# Patient Record
Sex: Male | Born: 1975 | Hispanic: No | Marital: Single | Smoking: Never smoker
Health system: Southern US, Community
[De-identification: ages and names within clinical notes are randomized; demographics above are authoritative.]

---

## 2009-02-19 ENCOUNTER — Emergency Department (HOSPITAL_COMMUNITY): Admission: EM | Admit: 2009-02-19 | Discharge: 2009-02-19 | Payer: Self-pay | Admitting: Emergency Medicine

## 2009-02-21 ENCOUNTER — Encounter: Admission: RE | Admit: 2009-02-21 | Discharge: 2009-02-21 | Payer: Self-pay | Admitting: Emergency Medicine

## 2010-06-23 LAB — URINALYSIS, ROUTINE W REFLEX MICROSCOPIC
Bilirubin Urine: NEGATIVE
Hgb urine dipstick: NEGATIVE
Ketones, ur: NEGATIVE mg/dL
Protein, ur: NEGATIVE mg/dL
Urobilinogen, UA: 0.2 mg/dL (ref 0.0–1.0)

## 2010-06-23 LAB — COMPREHENSIVE METABOLIC PANEL
ALT: 15 U/L (ref 0–53)
AST: 14 U/L (ref 0–37)
Albumin: 3.7 g/dL (ref 3.5–5.2)
Calcium: 8.8 mg/dL (ref 8.4–10.5)
Glucose, Bld: 97 mg/dL (ref 70–99)
Sodium: 137 mEq/L (ref 135–145)
Total Protein: 7.2 g/dL (ref 6.0–8.3)

## 2010-06-23 LAB — DIFFERENTIAL
Eosinophils Relative: 5 % (ref 0–5)
Lymphocytes Relative: 33 % (ref 12–46)
Lymphs Abs: 1.9 10*3/uL (ref 0.7–4.0)

## 2010-06-23 LAB — CBC
MCHC: 34.9 g/dL (ref 30.0–36.0)
MCV: 89.5 fL (ref 78.0–100.0)
Platelets: 251 10*3/uL (ref 150–400)
RDW: 13 % (ref 11.5–15.5)

## 2010-06-23 LAB — LIPASE, BLOOD: Lipase: 28 U/L (ref 11–59)

## 2013-07-18 DIAGNOSIS — R42 Dizziness and giddiness: Secondary | ICD-10-CM | POA: Insufficient documentation

## 2013-07-18 DIAGNOSIS — R55 Syncope and collapse: Secondary | ICD-10-CM | POA: Insufficient documentation

## 2013-07-18 DIAGNOSIS — R61 Generalized hyperhidrosis: Secondary | ICD-10-CM | POA: Insufficient documentation

## 2013-07-19 ENCOUNTER — Encounter (HOSPITAL_COMMUNITY): Payer: Self-pay | Admitting: Emergency Medicine

## 2013-07-19 ENCOUNTER — Emergency Department (HOSPITAL_COMMUNITY)
Admission: EM | Admit: 2013-07-19 | Discharge: 2013-07-19 | Disposition: A | Discharge status: Home / Self Care | Payer: Self-pay | Attending: Emergency Medicine | Admitting: Emergency Medicine

## 2013-07-19 DIAGNOSIS — R55 Syncope and collapse: Secondary | ICD-10-CM

## 2013-07-19 LAB — CBG MONITORING, ED: GLUCOSE-CAPILLARY: 117 mg/dL — AB (ref 70–99)

## 2013-07-19 NOTE — ED Notes (Signed)
Patient from PEDS.  Diaphoretic, bradycardic

## 2013-07-19 NOTE — Discharge Instructions (Signed)
Please call your doctor for a followup appointment within 24-48 hours. When you talk to your doctor please let them know that you were seen in the emergency department and have them acquire all of your records so that they can discuss the findings with you and formulate a treatment plan to fully care for your new and ongoing problems. ° °

## 2013-07-19 NOTE — ED Provider Notes (Signed)
CSN: [id number]     Arrival date & time 07/18/13  2358 History   First MD Initiated Contact with Patient 07/19/13 0013     Chief Complaint  Patient presents with  . Bradycardia     (Consider location/radiation/quality/duration/timing/severity/associated sxs/prior Treatment) HPI Comments: 38 year old male, no significant past medical history on no medications presents with a complaint of near syncope. He had brought his child to the pediatric emergency Department because of a seizure and while child was being evaluated he began to become lightheaded and dizzy, felt sweaty and as though he was going to pass out. He was found to be bradycardic and hypotensive, transported to the adult emergency department. The patient denies any prodromal symptoms before arriving at the hospital and has had a normal day, normal appetite, no family history of cardiac disease, the patient has no prior history of syncope or unexplained cardiac complaints, at this time he still feels lightheaded but has no other complaints.  The history is provided by the patient.    History reviewed. No pertinent past medical history. History reviewed. No pertinent past surgical history. History reviewed. No pertinent family history. History  Substance Use Topics  . Smoking status: Never Smoker   . Smokeless tobacco: Never Used  . Alcohol Use: No    Review of Systems  All other systems reviewed and are negative.     Allergies  Review of patient's allergies indicates no known allergies.  Home Medications   Prior to Admission medications   Not on File   BP 110/77  Pulse 69  Temp(Src) 97.9 F (36.6 C) (Oral)  Resp 17  SpO2 100% Physical Exam  Nursing note and vitals reviewed. Constitutional: He appears well-developed and well-nourished. No distress.  HENT:  Head: Normocephalic and atraumatic.  Mouth/Throat: Oropharynx is clear and moist. No oropharyngeal exudate.  Eyes: Conjunctivae and EOM are normal.  Pupils are equal, round, and reactive to light. Right eye exhibits no discharge. Left eye exhibits no discharge. No scleral icterus.  Neck: Normal range of motion. Neck supple. No JVD present. No thyromegaly present.  Cardiovascular: Normal rate, regular rhythm and normal heart sounds.  Exam reveals no gallop and no friction rub.   No murmur heard. Slightly weak distal pulses at the radial arteries, normal capillary refill, no JVD  Pulmonary/Chest: Effort normal and breath sounds normal. No respiratory distress. He has no wheezes. He has no rales.  Abdominal: Soft. Bowel sounds are normal. He exhibits no distension and no mass. There is no tenderness.  Musculoskeletal: Normal range of motion. He exhibits no edema and no tenderness.  Lymphadenopathy:    He has no cervical adenopathy.  Neurological: He is alert. Coordination normal.  Skin: Skin is warm. No rash noted. He is diaphoretic. No erythema.  Psychiatric: He has a normal mood and affect. His behavior is normal.    ED Course  Procedures (including critical care time) Labs Review Labs Reviewed  CBG MONITORING, ED - Abnormal; Notable for the following:    Glucose-Capillary 117 (*)    All other components within normal limits    Imaging Review No results found.   EKG Interpretation   Date/Time:  Thursday July 19 2013 00:19:04 EDT Ventricular Rate:  61 PR Interval:  144 QRS Duration: 95 QT Interval:  379 QTC Calculation: 382 R Axis:   77 Text Interpretation:  Sinus rhythm Normal ECG No old tracing to compare  Confirmed by Jackee Glasner  MD, Prairie Stenberg ([id number]) on 07/19/2013 12:21:56 AM  MDM   Final diagnoses:  Vaso vagal episode    The patient's physical exam is rather benign, his blood pressure is 99/70, heart rate is around 60, this appears to be a vagal episode likely related to the emotional event surrounding his child's seizure. He has no prior cardiac history, rule out Wolff-Parkinson-White or other signs of primary  arrhythmia, fluid challenge, short observational period  Reevaluated, completely symptom-free at this time - EKG without signs of Wolff-Parkinson-White, informed patient, stable for  Spencer RollerBrian D Murrell Dome, MD 07/19/13 [phone]

## 2015-01-03 ENCOUNTER — Emergency Department (HOSPITAL_COMMUNITY)
Admission: EM | Admit: 2015-01-03 | Discharge: 2015-01-03 | Disposition: A | Discharge status: Home / Self Care | Payer: 59 | Attending: Emergency Medicine | Admitting: Emergency Medicine

## 2015-01-03 ENCOUNTER — Emergency Department (HOSPITAL_COMMUNITY): Payer: 59

## 2015-01-03 ENCOUNTER — Encounter (HOSPITAL_COMMUNITY): Payer: Self-pay | Admitting: Emergency Medicine

## 2015-01-03 DIAGNOSIS — R0602 Shortness of breath: Secondary | ICD-10-CM | POA: Insufficient documentation

## 2015-01-03 DIAGNOSIS — R519 Headache, unspecified: Secondary | ICD-10-CM

## 2015-01-03 DIAGNOSIS — Z79899 Other long term (current) drug therapy: Secondary | ICD-10-CM | POA: Diagnosis not present

## 2015-01-03 DIAGNOSIS — R202 Paresthesia of skin: Secondary | ICD-10-CM | POA: Insufficient documentation

## 2015-01-03 DIAGNOSIS — R112 Nausea with vomiting, unspecified: Secondary | ICD-10-CM | POA: Insufficient documentation

## 2015-01-03 DIAGNOSIS — R51 Headache: Secondary | ICD-10-CM | POA: Diagnosis not present

## 2015-01-03 DIAGNOSIS — R531 Weakness: Secondary | ICD-10-CM | POA: Insufficient documentation

## 2015-01-03 DIAGNOSIS — R200 Anesthesia of skin: Secondary | ICD-10-CM | POA: Insufficient documentation

## 2015-01-03 DIAGNOSIS — R42 Dizziness and giddiness: Secondary | ICD-10-CM | POA: Diagnosis not present

## 2015-01-03 LAB — BASIC METABOLIC PANEL
ANION GAP: 5 (ref 5–15)
BUN: 7 mg/dL (ref 6–20)
CO2: 30 mmol/L (ref 22–32)
Calcium: 9.1 mg/dL (ref 8.9–10.3)
Chloride: 103 mmol/L (ref 101–111)
Creatinine, Ser: 0.95 mg/dL (ref 0.61–1.24)
Glucose, Bld: 111 mg/dL — ABNORMAL HIGH (ref 65–99)
POTASSIUM: 3.9 mmol/L (ref 3.5–5.1)
SODIUM: 138 mmol/L (ref 135–145)

## 2015-01-03 LAB — URINALYSIS, ROUTINE W REFLEX MICROSCOPIC
Bilirubin Urine: NEGATIVE
Glucose, UA: NEGATIVE mg/dL
Hgb urine dipstick: NEGATIVE
Ketones, ur: NEGATIVE mg/dL
Leukocytes, UA: NEGATIVE
NITRITE: NEGATIVE
PROTEIN: NEGATIVE mg/dL
SPECIFIC GRAVITY, URINE: 1.007 (ref 1.005–1.030)
UROBILINOGEN UA: 0.2 mg/dL (ref 0.0–1.0)
pH: 7 (ref 5.0–8.0)

## 2015-01-03 LAB — CBC
HEMATOCRIT: 42.4 % (ref 39.0–52.0)
HEMOGLOBIN: 14.9 g/dL (ref 13.0–17.0)
MCH: 30 pg (ref 26.0–34.0)
MCHC: 35.1 g/dL (ref 30.0–36.0)
MCV: 85.5 fL (ref 78.0–100.0)
Platelets: 228 10*3/uL (ref 150–400)
RBC: 4.96 MIL/uL (ref 4.22–5.81)
RDW: 12.8 % (ref 11.5–15.5)
WBC: 5.5 10*3/uL (ref 4.0–10.5)

## 2015-01-03 LAB — CBG MONITORING, ED: GLUCOSE-CAPILLARY: 86 mg/dL (ref 65–99)

## 2015-01-03 MED ORDER — SODIUM CHLORIDE 0.9 % IV SOLN: 1000.0000 mL | INTRAVENOUS | Status: DC

## 2015-01-03 MED ORDER — SODIUM CHLORIDE 0.9 % IV SOLN
1000.0000 mL | Freq: Once | INTRAVENOUS | Status: AC
  Administered 2015-01-03: 1000 mL via INTRAVENOUS

## 2015-01-03 NOTE — ED Notes (Signed)
Went in to assess patient.  Patient wanted me to wait, he was undressing.

## 2015-01-03 NOTE — ED Provider Notes (Signed)
CSN: [id number]     Arrival date & time 01/03/15  [id number] History   First MD Initiated Contact with Patient 01/03/15 0845     Chief Complaint  Patient presents with  . Headache  . Emesis  . Weakness     (Consider location/radiation/quality/duration/timing/severity/associated sxs/prior Treatment) HPI Spencer Ritter is a 39 y.o. male with PMH significant for headaches who presents with weakness.  Patient reports he experienced a headache on Tuesday that was different than his normal headaches, and much more severe.  The pain was located on the right side.  Associated symptoms included N/V with 3 episodes of nonbloody nonbilious emesis.  The headache and emesis has since resolved.  He states  That since Tuesday he has been experiencing generalized weakness, with slightly more left sided weakness.  Patient is ambulatory.  Endorses dizziness, lightheadedness, numbness/tingling in the left lower extremity, and SOB.  Denies visual disturbances, neck pain, fevers, sore throat, cough, CP, abdominal pain, or urinary symptoms.    History reviewed. No pertinent past medical history. History reviewed. No pertinent past surgical history. No family history on file. Social History  Substance Use Topics  . Smoking status: Never Smoker   . Smokeless tobacco: Never Used  . Alcohol Use: No    Review of Systems  All other systems negative unless otherwise stated in HPI   Allergies  Review of patient's allergies indicates no known allergies.  Home Medications   Prior to Admission medications   Medication Sig Start Date End Date Taking? Authorizing Provider  Multiple Vitamin (MULTIVITAMIN WITH MINERALS) TABS tablet Take 1 tablet by mouth daily.   Yes Historical Provider, MD   BP 112/70 mmHg  Pulse 61  Temp(Src) 98.6 F (37 C) (Oral)  Resp 13  Ht 5\' 11"  (1.803 m)  Wt 160 lb (72.576 kg)  BMI 22.33 kg/m2  SpO2 98% Physical Exam  Constitutional: He is oriented to person, place, and  time. He appears well-developed and well-nourished.  HENT:  Head: Normocephalic and atraumatic.  Mouth/Throat: Oropharynx is clear and moist.  Eyes: Pupils are equal, round, and reactive to light.  Neck: Normal range of motion. Neck supple.  Cardiovascular: Normal rate and regular rhythm.   No murmur heard. Pulmonary/Chest: Effort normal and breath sounds normal. No respiratory distress. He has no wheezes. He has no rales.  Abdominal: Soft. Bowel sounds are normal. He exhibits no distension. There is no tenderness.  Musculoskeletal: Normal range of motion.  Lymphadenopathy:    He has no cervical adenopathy.  Neurological: He is alert and oriented to person, place, and time.  Mental Status:   AOx3 Cranial Nerves:  I-not tested  II-PERRLA  III, IV, VI-EOMs intact  V-temporal and masseter strength intact  VII-symmetrical facial movements intact, no facial droop  VIII-hearing grossly intact bilaterally  IX, X-gag intact  XI-strength of sternomastoid and trapezius muscles 5/5  XII-tongue midline Motor:   Good muscle bulk and tone  Strength 5/5 bilaterally in upper and lower extremities   Cerebellar--RAMs, finger to nose intact  Romberg--maintains balance with eyes closed  Casual and tandem gait normal without ataxia  No pronator drift Sensory:  Intact in upper and lower extremities      Skin: Skin is warm and dry.  Psychiatric: He has a normal mood and affect. His behavior is normal.    ED Course  Procedures (including critical care time) Labs Review Labs Reviewed  BASIC METABOLIC PANEL - Abnormal; Notable for the following:    Glucose, Bld  111 (*)    All other components within normal limits  CBC  URINALYSIS, ROUTINE W REFLEX MICROSCOPIC (NOT AT [facility])  CBG MONITORING, ED    Imaging Review Dg Chest 2 View  01/03/2015  CLINICAL DATA:  Headache.  Tachycardia.  Shortness of breath. EXAM: CHEST - 2 VIEW COMPARISON:  Two-view chest x-ray 09/17/2013. FINDINGS: The heart  size and mediastinal contours are within normal limits. Both lungs are clear. The visualized skeletal structures are unremarkable. IMPRESSION: No active disease. Electronically Signed   By: Marin Roberts M.D.   On: 01/03/2015 09:50   Ct Head Wo Contrast  01/03/2015  CLINICAL DATA:  Acute onset left-sided weakness; dizziness. Recent right-sided headache with nausea and vomiting EXAM: CT HEAD WITHOUT CONTRAST TECHNIQUE: Contiguous axial images were obtained from the base of the skull through the vertex without intravenous contrast. COMPARISON:  None. FINDINGS: The ventricles are normal in size and configuration. There is no intracranial mass hemorrhage, extra-axial fluid collection, or midline shift. Gray-white compartments are normal. No acute infarct evident. Bony calvarium appears intact. The visualized mastoid air cells are clear. IMPRESSION: Study within normal limits. Electronically Signed   By: Bretta Bang III M.D.   On: 01/03/2015 09:39   I have personally reviewed and evaluated these images and lab results as part of my medical decision-making.   EKG Interpretation None      MDM   Final diagnoses:  Nonintractable headache, unspecified chronicity pattern, unspecified headache type    Patient presents with weakness, L>R x 3 days.  VSS, NAD, nontoxic appearing.  On exam, no focal neurological deficits.  Will obtain head CT given patient's abnormal presentation.  Labs include UA, BMP, CBC, and CBG.  CXR ordered.  EKG shows NSR. Patient is not orthostatic. UA shows no signs of infection.  BMP and CBC show no abnormalities. CXR shows no active disease.  CT head shows no abnormalities. No mass, no hemorrhage, or midline shift.  No acute infarct. Discussed patient's workup and no acute findings.  Patient to follow up with PCP this week.  Patient stable for discharge.  Patient agrees with the above plan.  Case has been discussed with Dr. Clydene Pugh who agrees with the above plan for  discharge.       Cheri Fowler, PA-C 01/03/15 1027  Lyndal Pulley, MD 01/05/15 [phone]

## 2015-01-03 NOTE — ED Notes (Signed)
Pt c/o on Tuesday having severe headache, vomiting and weakness. Pt reports headache and vomiting resolved but weakness remains. Pt seen by Dr Algie CofferKadakia yesterday. Pt had appointment with PMD today at 9, but cancelled due to how he was feeling.

## 2015-01-03 NOTE — Discharge Instructions (Signed)
Migraine Headache  A migraine headache is very bad, throbbing pain on one or both sides of your head. Talk to your doctor about what things may bring on (trigger) your migraine headaches.  HOME CARE  · Only take medicines as told by your doctor.  · Lie down in a dark, quiet room when you have a migraine.  · Keep a journal to find out if certain things bring on migraine headaches. For example, write down:    What you eat and drink.    How much sleep you get.    Any change to your diet or medicines.  · Lessen how much alcohol you drink.  · Quit smoking if you smoke.  · Get enough sleep.  · Lessen any stress in your life.  · Keep lights dim if bright lights bother you or make your migraines worse.  GET HELP RIGHT AWAY IF:   · Your migraine becomes really bad.  · You have a fever.  · You have a stiff neck.  · You have trouble seeing.  · Your muscles are weak, or you lose muscle control.  · You lose your balance or have trouble walking.  · You feel like you will pass out (faint), or you pass out.  · You have really bad symptoms that are different than your first symptoms.  MAKE SURE YOU:   · Understand these instructions.  · Will watch your condition.  · Will get help right away if you are not doing well or get worse.     This information is not intended to replace advice given to you by your health care provider. Make sure you discuss any questions you have with your health care provider.     Document Released: 12/16/2007 Document Revised: 05/31/2011 Document Reviewed: 11/13/2012  Elsevier Interactive Patient Education ©2016 [employer]

## 2016-06-25 IMAGING — CT CT HEAD W/O CM
1 series · 16 of 30 positions shown, 20 images · non-contrast
Comparison: None.

CLINICAL DATA: Acute onset left-sided weakness; dizziness. Recent
right-sided headache with nausea and vomiting

EXAM:
CT HEAD WITHOUT CONTRAST
TECHNIQUE: Contiguous axial images were obtained from the base of the skull
through the vertex without intravenous contrast.

[Series 2: head 5.0 h30s · axial · 0.43mm/px · z∈[-43,+92]mm · 16 of 31 slices shown, 20 images]
[im 2/31  brain]
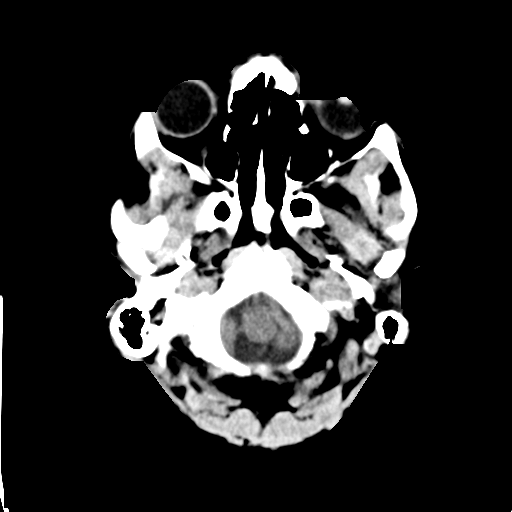
[im 2/31  bone]
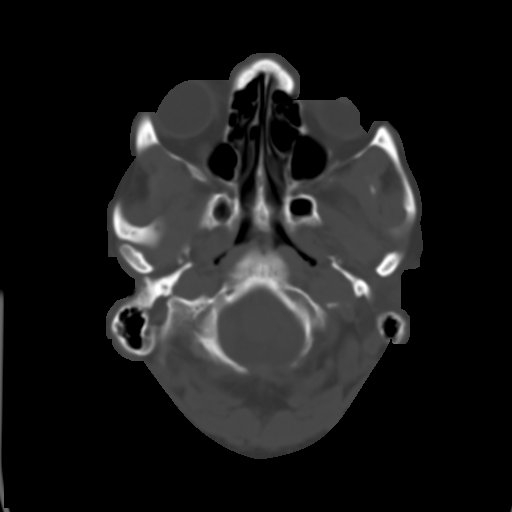
[im 4/31  brain]
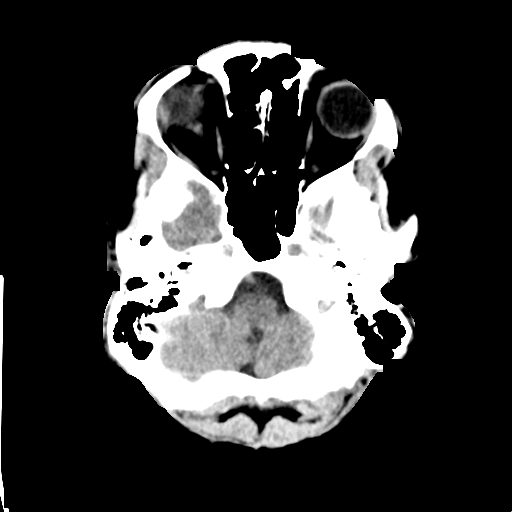
[im 6/31  brain]
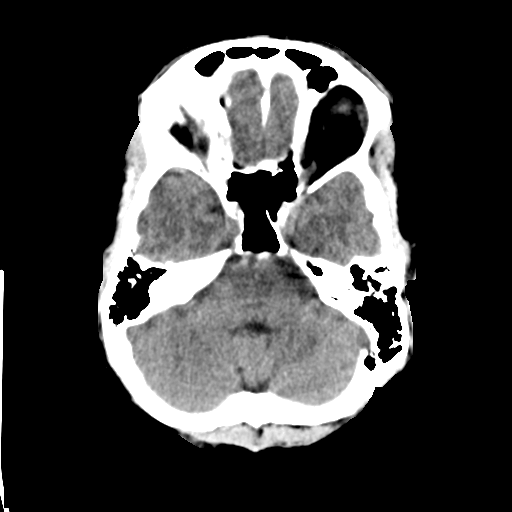
[im 8/31  brain]
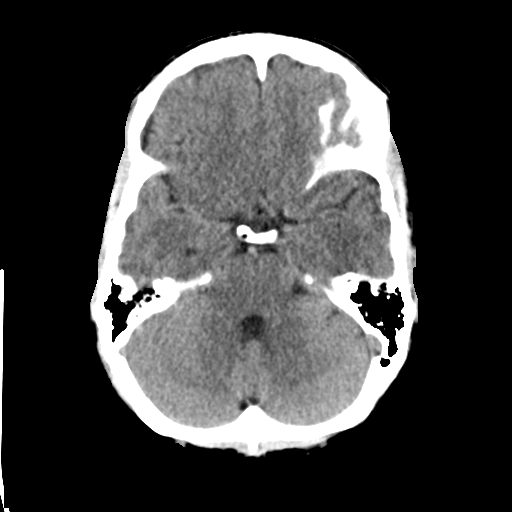
[im 9/31  brain]
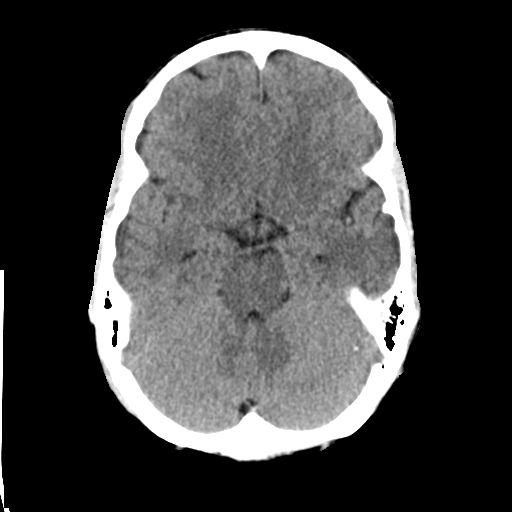
[im 9/31  bone]
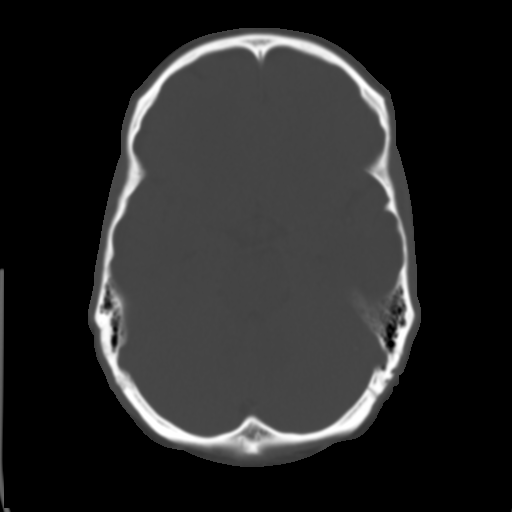
[im 11/31  brain]
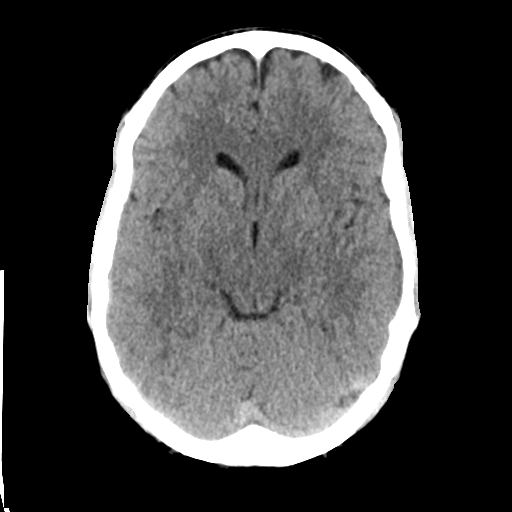
[im 13/31  brain]
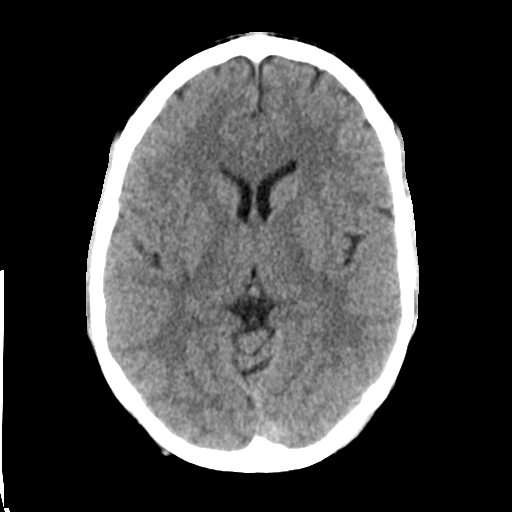
[im 15/31  brain]
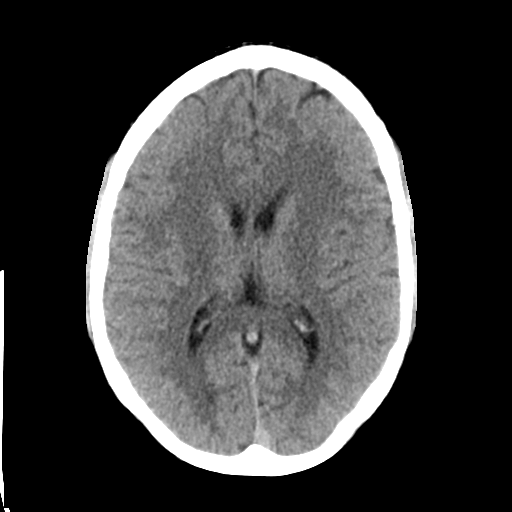
[im 16/31  brain]
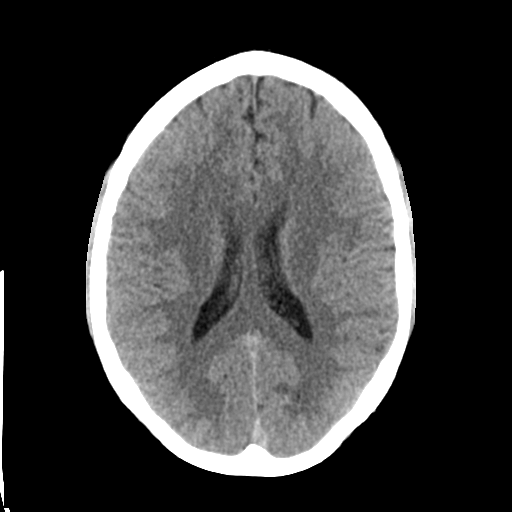
[im 16/31  bone]
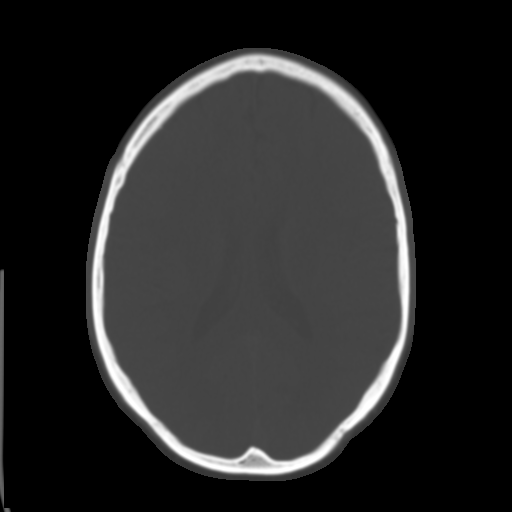
[im 18/31  brain]
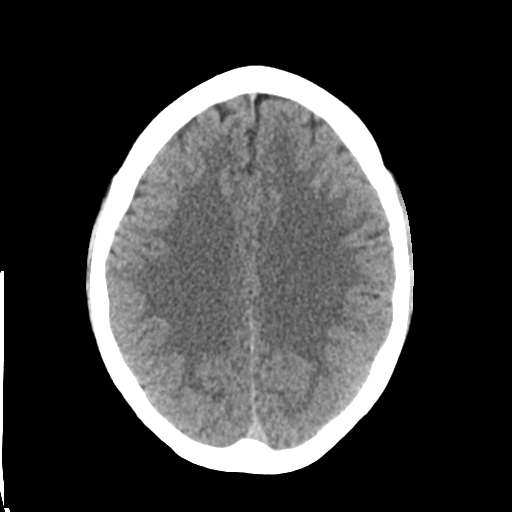
[im 20/31  brain]
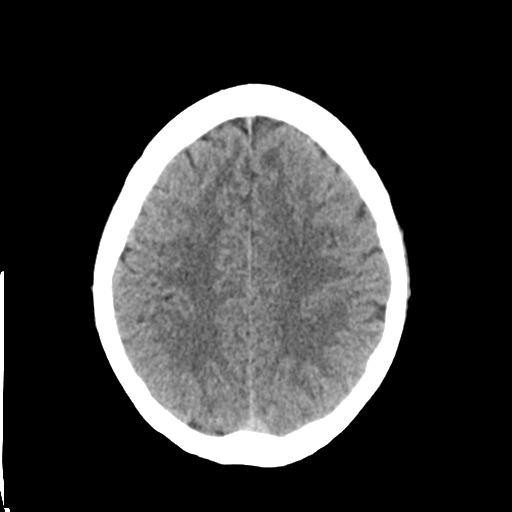
[im 22/31  brain]
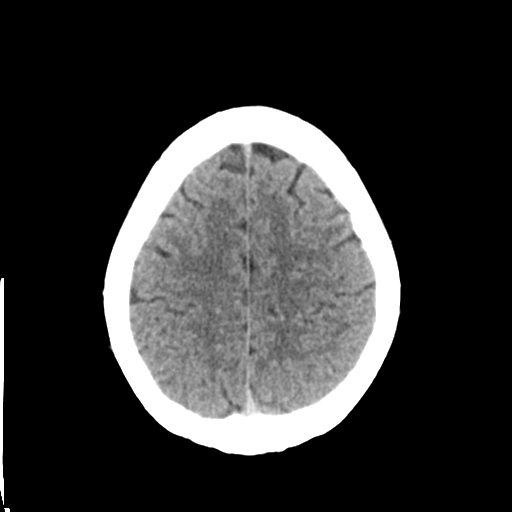
[im 23/31  brain]
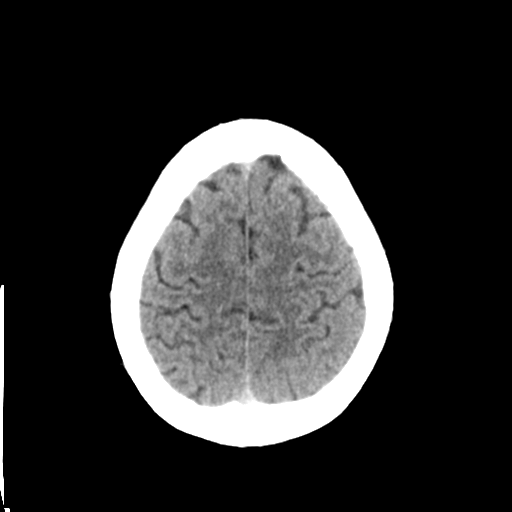
[im 23/31  bone]
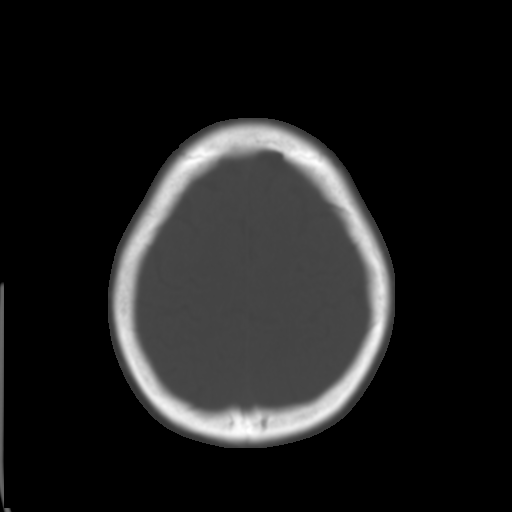
[im 25/31  brain]
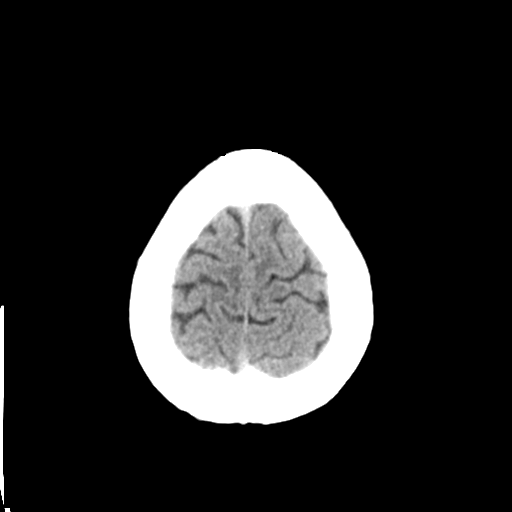
[im 27/31  brain]
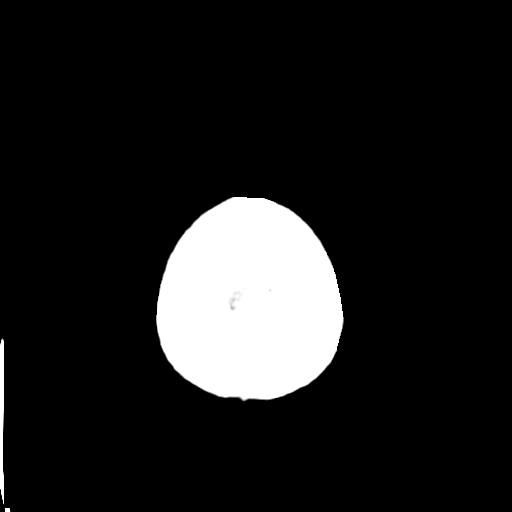
[im 29/31  brain]
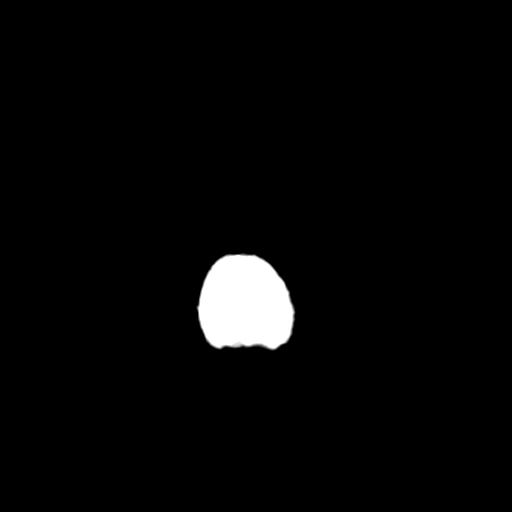

[16 of 30 positions shown; findings below may reference images not displayed]

FINDINGS: The ventricles are normal in size and configuration. There is no
intracranial mass hemorrhage, extra-axial fluid collection, or
midline shift. Gray-white compartments are normal. No acute infarct
evident. Bony calvarium appears intact. The visualized mastoid air
cells are clear.
IMPRESSION: Study within normal limits.

## 2019-07-27 ENCOUNTER — Ambulatory Visit: Payer: Self-pay | Attending: Internal Medicine

## 2019-07-27 DIAGNOSIS — Z20822 Contact with and (suspected) exposure to covid-19: Secondary | ICD-10-CM | POA: Insufficient documentation

## 2019-07-28 LAB — SARS-COV-2, NAA 2 DAY TAT

## 2019-07-28 LAB — NOVEL CORONAVIRUS, NAA: SARS-CoV-2, NAA: NOT DETECTED

## 2020-12-12 ENCOUNTER — Ambulatory Visit
Admission: RE | Admit: 2020-12-12 | Discharge: 2020-12-12 | Disposition: A | Discharge status: Home / Self Care | Payer: Self-pay | Source: Ambulatory Visit | Attending: Cardiovascular Disease | Admitting: Cardiovascular Disease

## 2020-12-12 ENCOUNTER — Other Ambulatory Visit: Payer: Self-pay | Admitting: Cardiovascular Disease

## 2020-12-12 DIAGNOSIS — R1084 Generalized abdominal pain: Secondary | ICD-10-CM

## 2021-01-16 ENCOUNTER — Other Ambulatory Visit: Payer: Self-pay | Admitting: Internal Medicine

## 2021-01-17 LAB — CBC
HCT: 39.9 % (ref 38.5–50.0)
Hemoglobin: 13 g/dL — ABNORMAL LOW (ref 13.2–17.1)
MCH: 24.9 pg — ABNORMAL LOW (ref 27.0–33.0)
MCHC: 32.6 g/dL (ref 32.0–36.0)
MCV: 76.3 fL — ABNORMAL LOW (ref 80.0–100.0)
MPV: 11.1 fL (ref 7.5–12.5)
Platelets: 339 10*3/uL (ref 140–400)
RBC: 5.23 10*6/uL (ref 4.20–5.80)
RDW: 13.9 % (ref 11.0–15.0)
WBC: 13.9 10*3/uL — ABNORMAL HIGH (ref 3.8–10.8)

## 2021-01-17 LAB — VITAMIN D 25 HYDROXY (VIT D DEFICIENCY, FRACTURES): Vit D, 25-Hydroxy: 26 ng/mL — ABNORMAL LOW (ref 30–100)

## 2021-01-17 LAB — COMPLETE METABOLIC PANEL WITH GFR
AG Ratio: 2 (calc) (ref 1.0–2.5)
ALT: 13 U/L (ref 9–46)
AST: 14 U/L (ref 10–40)
Albumin: 4.2 g/dL (ref 3.6–5.1)
Alkaline phosphatase (APISO): 89 U/L (ref 36–130)
BUN: 12 mg/dL (ref 7–25)
CO2: 25 mmol/L (ref 20–32)
Calcium: 9.7 mg/dL (ref 8.6–10.3)
Chloride: 106 mmol/L (ref 98–110)
Creat: 0.76 mg/dL (ref 0.60–1.29)
Globulin: 2.1 g/dL (calc) (ref 1.9–3.7)
Glucose, Bld: 192 mg/dL — ABNORMAL HIGH (ref 65–99)
Potassium: 3.4 mmol/L — ABNORMAL LOW (ref 3.5–5.3)
Sodium: 143 mmol/L (ref 135–146)
Total Bilirubin: 0.5 mg/dL (ref 0.2–1.2)
Total Protein: 6.3 g/dL (ref 6.1–8.1)
eGFR: 114 mL/min/{1.73_m2} (ref >=60)

## 2021-01-17 LAB — LIPID PANEL
Cholesterol: 214 mg/dL — ABNORMAL HIGH (ref <200)
HDL: 43 mg/dL (ref >=40)
LDL Cholesterol (Calc): 126 mg/dL (calc) — ABNORMAL HIGH
Non-HDL Cholesterol (Calc): 171 mg/dL (calc) — ABNORMAL HIGH (ref <130)
Total CHOL/HDL Ratio: 5 (calc) — ABNORMAL HIGH (ref <5.0)
Triglycerides: 292 mg/dL — ABNORMAL HIGH (ref <150)

## 2021-01-17 LAB — PSA: PSA: 0.54 ng/mL (ref <=4.00)

## 2021-01-17 LAB — TSH: TSH: 0.48 mIU/L (ref 0.40–4.50)

## 2022-06-04 IMAGING — DX DG ABDOMEN 2V
2 series · 2 of 2 positions shown · non-contrast
Comparison: None.

CLINICAL DATA: Right lower quadrant and generalized abdominal pain

EXAM:
ABDOMEN - 2 VIEW

[dg abd 2 views (1 of 2)]
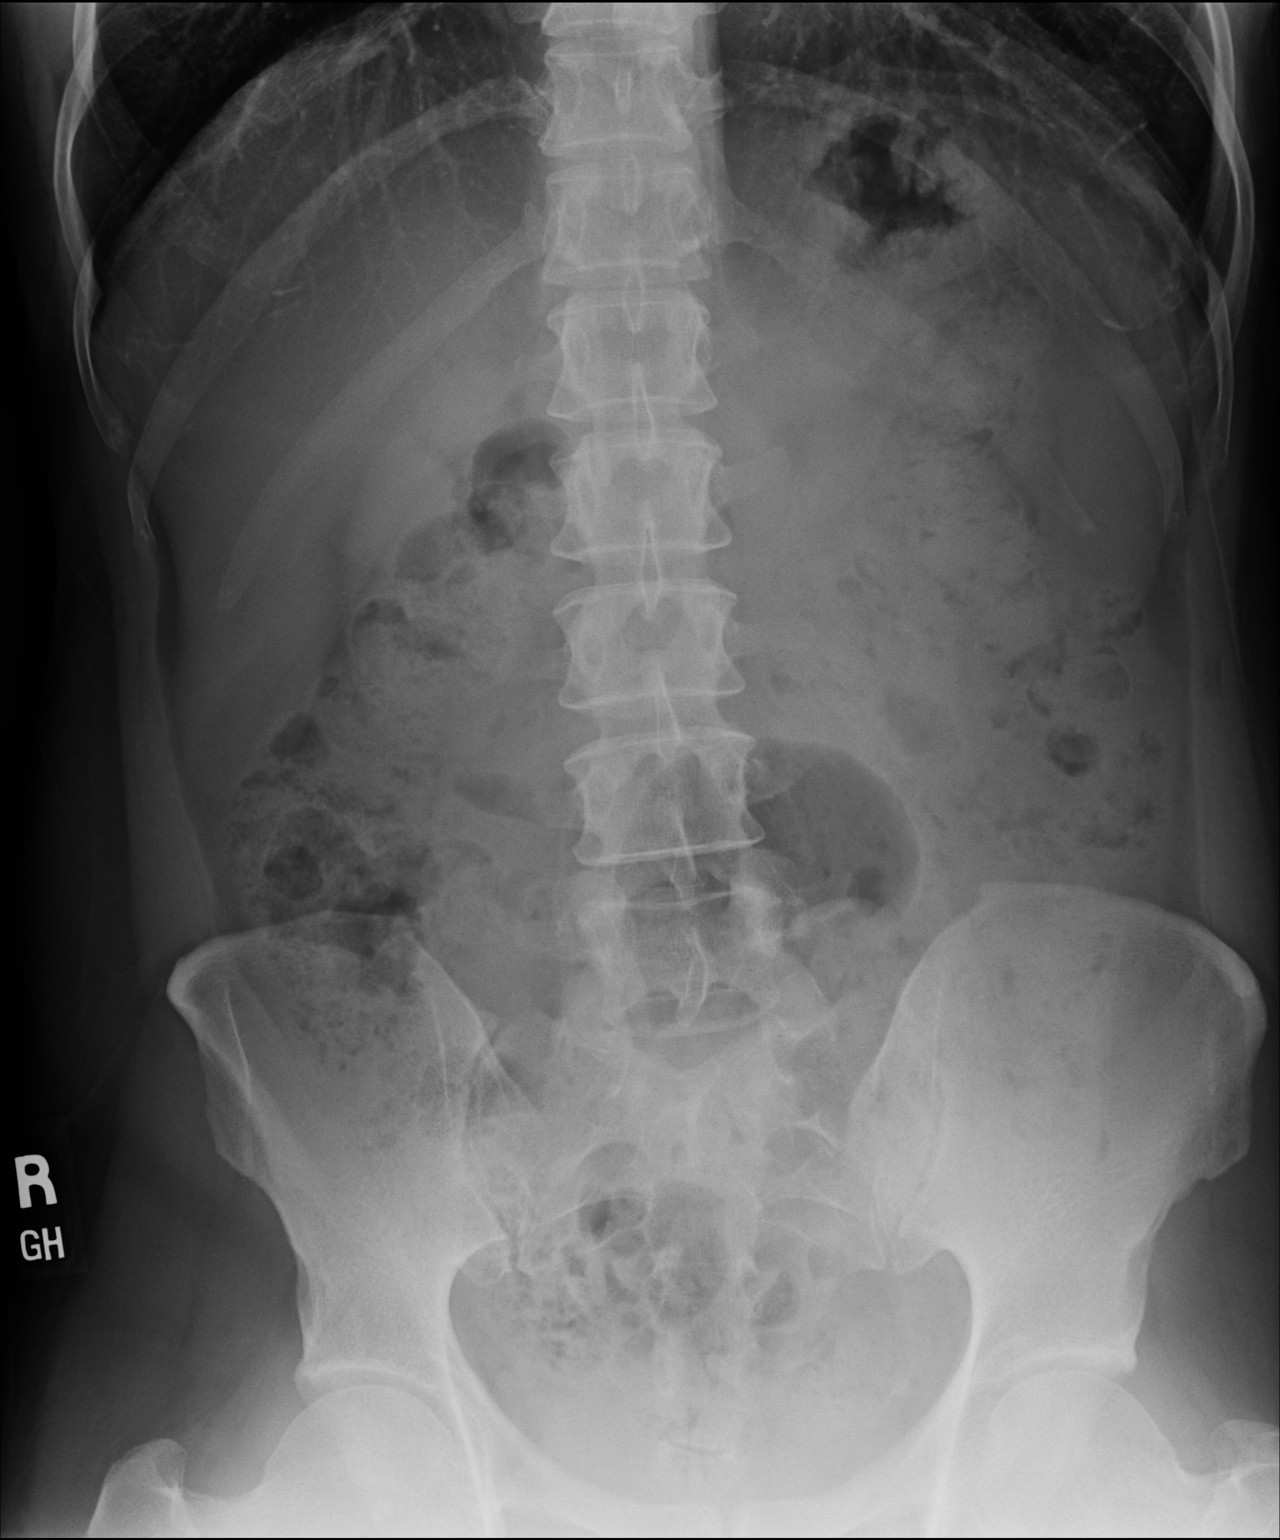

[dg abd 2 views (2 of 2)]
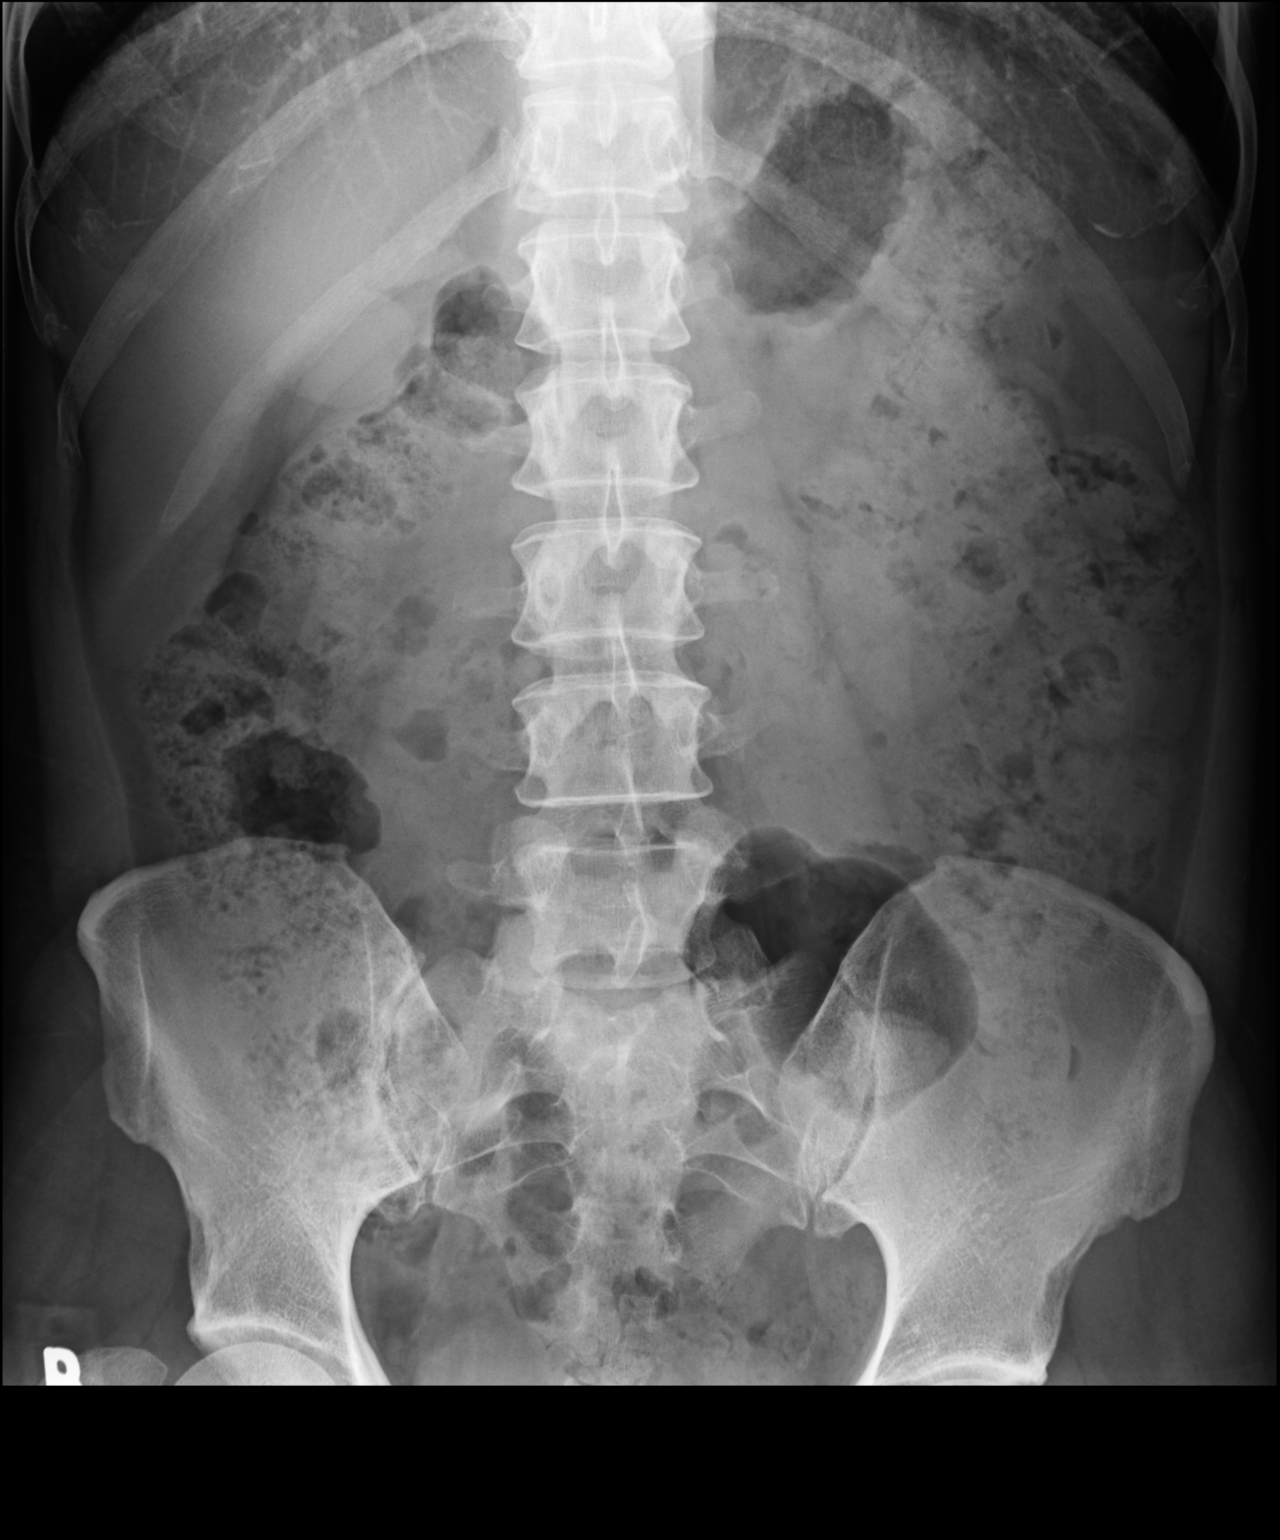

[2 of 2 positions shown; findings below may reference images not displayed]

FINDINGS: Normal bowel gas pattern. No abnormal calcifications. Large volume
of formed stool throughout the colon suggests constipation. No
osseous abnormality.
IMPRESSION: Large volume of formed stool throughout the colon suggests
constipation.

Otherwise, unremarkable radiographs.

## 2024-03-06 ENCOUNTER — Encounter (HOSPITAL_BASED_OUTPATIENT_CLINIC_OR_DEPARTMENT_OTHER): Payer: Self-pay | Admitting: Emergency Medicine

## 2024-03-06 ENCOUNTER — Other Ambulatory Visit: Payer: Self-pay

## 2024-03-06 ENCOUNTER — Emergency Department (HOSPITAL_BASED_OUTPATIENT_CLINIC_OR_DEPARTMENT_OTHER)
Admission: EM | Admit: 2024-03-06 | Discharge: 2024-03-06 | Disposition: A | Discharge status: Home / Self Care | Attending: Emergency Medicine | Admitting: Emergency Medicine

## 2024-03-06 DIAGNOSIS — R42 Dizziness and giddiness: Secondary | ICD-10-CM | POA: Diagnosis present

## 2024-03-06 DIAGNOSIS — R12 Heartburn: Secondary | ICD-10-CM | POA: Diagnosis not present

## 2024-03-06 DIAGNOSIS — R519 Headache, unspecified: Secondary | ICD-10-CM | POA: Diagnosis not present

## 2024-03-06 DIAGNOSIS — R5383 Other fatigue: Secondary | ICD-10-CM | POA: Diagnosis not present

## 2024-03-06 LAB — URINALYSIS, ROUTINE W REFLEX MICROSCOPIC
Bilirubin Urine: NEGATIVE
Glucose, UA: NEGATIVE mg/dL
Hgb urine dipstick: NEGATIVE
Ketones, ur: NEGATIVE mg/dL
Leukocytes,Ua: NEGATIVE
Nitrite: NEGATIVE
Protein, ur: NEGATIVE mg/dL
Specific Gravity, Urine: 1.005 (ref 1.005–1.030)
pH: 6 (ref 5.0–8.0)

## 2024-03-06 LAB — COMPREHENSIVE METABOLIC PANEL WITH GFR
ALT: 18 U/L (ref 0–44)
AST: 17 U/L (ref 15–41)
Albumin: 4.4 g/dL (ref 3.5–5.0)
Alkaline Phosphatase: 83 U/L (ref 38–126)
Anion gap: 9 (ref 5–15)
BUN: 13 mg/dL (ref 6–20)
CO2: 29 mmol/L (ref 22–32)
Calcium: 9.7 mg/dL (ref 8.9–10.3)
Chloride: 98 mmol/L (ref 98–111)
Creatinine, Ser: 0.92 mg/dL (ref 0.61–1.24)
GFR, Estimated: >60 mL/min (ref >60)
Glucose, Bld: 90 mg/dL (ref 70–99)
Potassium: 3.5 mmol/L (ref 3.5–5.1)
Sodium: 136 mmol/L (ref 135–145)
Total Bilirubin: 0.6 mg/dL (ref 0.0–1.2)
Total Protein: 7.9 g/dL (ref 6.5–8.1)

## 2024-03-06 LAB — CBC
HCT: 40.6 % (ref 39.0–52.0)
Hemoglobin: 14.2 g/dL (ref 13.0–17.0)
MCH: 30.1 pg (ref 26.0–34.0)
MCHC: 35 g/dL (ref 30.0–36.0)
MCV: 86.2 fL (ref 80.0–100.0)
Platelets: 250 K/uL (ref 150–400)
RBC: 4.71 MIL/uL (ref 4.22–5.81)
RDW: 12.8 % (ref 11.5–15.5)
WBC: 6.7 K/uL (ref 4.0–10.5)
nRBC: 0 % (ref 0.0–0.2)

## 2024-03-06 LAB — TROPONIN T, HIGH SENSITIVITY: Troponin T High Sensitivity: <15 ng/L (ref 0–19)

## 2024-03-06 LAB — CBG MONITORING, ED: Glucose-Capillary: 97 mg/dL (ref 70–99)

## 2024-03-06 MED ORDER — DOCUSATE SODIUM 100 MG PO CAPS: 100.0000 mg | ORAL_CAPSULE | Freq: Once | ORAL | Status: DC

## 2024-03-06 NOTE — ED Triage Notes (Addendum)
 Dizziness and right sided HA after work Thursday. Has felt fatigued since. Has pain around C7. 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 Has felt near syncope at times. Denies flu symptoms. Endorses heartburn feeling intermittently.

## 2024-03-06 NOTE — ED Provider Notes (Signed)
 [place] [facility] DEPART[facility] AT [facility] Provider Note   CSN: [id number] Arrival date & time: 03/06/24  1539     Patient presents with: Near Syncope   Spencer Ritter is a 48 y.o. male patient who presents to the emergency department today for further evaluation of dizziness which occurred on Thursday.  Patient works in [employer] and primarily does most of his work overhead.  He just got finished with an 11-hour shift and felt dizzy.  He states that the room was spinning and he felt unsteady on his feet.  His friend had to help him ambulate secondary to the dizziness.  That resolved spontaneously and he has not had any episodes since.  Patient however has had some intermittent headaches since that episode localized to the right temporal region.  Denies any visual disturbances, focal weakness, focal numbness.  He denies any fever, chills, cough, congestion, shortness of breath, chest pain.    Near Syncope       Prior to Admission medications  Medication Sig Start Date End Date Taking? Authorizing Provider  Multiple Vitamin (MULTIVITAMIN WITH MINERALS) TABS tablet Take 1 tablet by mouth daily.    [provider]    Allergies: Patient has no known allergies.    Review of Systems  Cardiovascular:  Positive for near-syncope.  All other systems reviewed and are negative.   Updated Vital Signs BP 121/83 (BP Location: Right Arm)   Pulse 78   Temp 97.9 F (36.6 C)   Resp 16   SpO2 100%   Physical Exam Vitals and nursing note reviewed.  Constitutional:      General: He is not in acute distress.    Appearance: Normal appearance.  HENT:     Head: Normocephalic and atraumatic.     Ears:     Comments: Left external auditory canal is normal.  TM is normal with good cone of light.  Right external auditory canal does have moderate amount of cerumen.  Unable to visualize TM on the right due to cerumen impaction. Eyes:     General:        Right  eye: No discharge.        Left eye: No discharge.  Cardiovascular:     Comments: Regular rate and rhythm.  S1/S2 are distinct without any evidence of murmur, rubs, or gallops.  Radial pulses are 2+ bilaterally.  Dorsalis pedis pulses are 2+ bilaterally.  No evidence of pedal edema. Pulmonary:     Comments: Clear to auscultation bilaterally.  Normal effort.  No respiratory distress.  No evidence of wheezes, rales, or rhonchi heard throughout. Abdominal:     General: Abdomen is flat. Bowel sounds are normal. There is no distension.     Tenderness: There is no abdominal tenderness. There is no guarding or rebound.  Musculoskeletal:        General: Normal range of motion.     Cervical back: Neck supple.  Skin:    General: Skin is warm and dry.     Findings: No rash.  Neurological:     General: No focal deficit present.     Mental Status: He is alert.     Comments: Cranial nerves II through XII are intact.  5/5 strength to the upper and lower extremities.  Normal sensation to the upper and lower extremities.  No dysmetria with finger-nose.  Speech is normal.  Answers all questions appropriately.  Psychiatric:        Mood and Affect: Mood  normal.        Behavior: Behavior normal.     (all labs ordered are listed, but only abnormal results are displayed) Labs Reviewed  URINALYSIS, ROUTINE W REFLEX MICROSCOPIC - Abnormal; Notable for the following components:      Result Value   Color, Urine COLORLESS (*)    All other components within normal limits  COMPREHENSIVE METABOLIC PANEL WITH GFR  CBC  CBG MONITORING, ED  TROPONIN T, HIGH SENSITIVITY    EKG: None  Radiology: No results found.   Procedures   Medications Ordered in the ED  docusate sodium  (COLACE) capsule 100 mg (has no administration in time range)    Clinical Course as of 03/06/24 1842  Tue Mar 06, 2024  1836 Shared decision making was done at length with her not to pursue a CT scan.  We went over the potential  diagnoses we be looking for.  Patient has a normal neurological exam at this time.  Patient opts to not go through with a CT scan which I think is totally reasonable.  I also talked about doing ear irrigation and earwax removal as this could be contributing to the patient's dizziness.  Patient also declines this.  We went over all the labs at the bedside.  Will plan to discharge home. [CF]    Clinical Course User Index [CF] Theotis Cameron HERO, PA-C    Medical Decision Making Sherrod Ludwig Loran Fleet is a 48 y.o. male patient who presents to the emergency department today for further evaluation of dizziness.This patient presents with dizziness, most consistent with a peripheral cause, likely BPPV.  Could be related to the cerumen impaction on the right.  No history of recent infection so doubt vestibular neuritis. History not consistent with meniere's disease.  Patient not complaining of hearing loss.  No history of trauma. No red flag features for central vertigo to include gradual onset, vertical/bidirectional or non-fatigable nystagmus, focal neurologic findings on exam (including inability to ambulate, ataxia, dysmetria). Presentation not consistent with an acute CNS infection, vertebral basilar artery insufficiency, cerebellar hemorrhage or infarction, intracranial mass or bleed.  Again offered CT scan and ear irrigation which he declined.    Amount and/or Complexity of Data Reviewed Labs: ordered.  Risk OTC drugs.     Final diagnoses:  Dizziness    ED Discharge Orders     None          Theotis Cameron [place], [place] 03/06/24 1842    Charlyn Sora, MD 03/07/24 1130

## 2024-03-06 NOTE — Discharge Instructions (Signed)
 As we discussed, you can get over-the-counter eardrops to help with the earwax removal.  This could be contributing to the dizziness you are experiencing.  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  This could be also contributing to the dizziness you been experiencing.  If you are having dizziness that will not go away with accompanying weakness in arm or leg followed by numbness I would come back to the emergency department immediately.

## 2024-04-05 ENCOUNTER — Other Ambulatory Visit: Payer: Self-pay | Admitting: Urology

## 2024-04-05 DIAGNOSIS — N50811 Right testicular pain: Secondary | ICD-10-CM

## 2024-04-13 ENCOUNTER — Ambulatory Visit
Admission: RE | Admit: 2024-04-13 | Discharge: 2024-04-13 | Disposition: A | Source: Ambulatory Visit | Attending: Urology

## 2024-04-13 DIAGNOSIS — N50811 Right testicular pain: Secondary | ICD-10-CM
